# Patient Record
Sex: Male | Born: 1995 | Race: White | Hispanic: No | Marital: Single | Smoking: Never smoker
Health system: Southern US, Community
[De-identification: ages and names within clinical notes are randomized; demographics above are authoritative.]

## PROBLEM LIST (undated history)

## (undated) DIAGNOSIS — E119 Type 2 diabetes mellitus without complications: Secondary | ICD-10-CM

## (undated) DIAGNOSIS — S52509A Unspecified fracture of the lower end of unspecified radius, initial encounter for closed fracture: Secondary | ICD-10-CM

---
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
## 2013-06-24 DIAGNOSIS — S52509A Unspecified fracture of the lower end of unspecified radius, initial encounter for closed fracture: Secondary | ICD-10-CM

## 2013-06-24 HISTORY — DX: Unspecified fracture of the lower end of unspecified radius, initial encounter for closed fracture: S52.509A

## 2013-06-25 ENCOUNTER — Other Ambulatory Visit (HOSPITAL_COMMUNITY): Payer: Self-pay | Admitting: Orthopedic Surgery

## 2013-06-25 ENCOUNTER — Encounter (HOSPITAL_COMMUNITY)
Admission: RE | Admit: 2013-06-25 | Discharge: 2013-06-25 | Disposition: A | Discharge status: Home / Self Care | Payer: BC Managed Care – PPO | Source: Ambulatory Visit | Attending: Orthopedic Surgery | Admitting: Orthopedic Surgery

## 2013-06-25 ENCOUNTER — Encounter (HOSPITAL_COMMUNITY): Payer: Self-pay

## 2013-06-25 HISTORY — DX: Unspecified fracture of the lower end of unspecified radius, initial encounter for closed fracture: S52.509A

## 2013-06-25 LAB — BASIC METABOLIC PANEL
BUN: 18 mg/dL (ref 6–23)
CALCIUM: 8.9 mg/dL (ref 8.4–10.5)
CO2: 25 meq/L (ref 19–32)
CREATININE: 0.75 mg/dL (ref 0.50–1.35)
Chloride: 99 mEq/L (ref 96–112)
GFR calc Af Amer: >90 mL/min (ref >90)
GLUCOSE: 271 mg/dL — AB (ref 70–99)
Potassium: 4.5 mEq/L (ref 3.7–5.3)
SODIUM: 137 meq/L (ref 137–147)

## 2013-06-25 LAB — CBC
HCT: 40.4 % (ref 39.0–52.0)
Hemoglobin: 14.2 g/dL (ref 13.0–17.0)
MCH: 31 pg (ref 26.0–34.0)
MCHC: 35.1 g/dL (ref 30.0–36.0)
MCV: 88.2 fL (ref 78.0–100.0)
PLATELETS: 162 10*3/uL (ref 150–400)
RBC: 4.58 MIL/uL (ref 4.22–5.81)
RDW: 12.8 % (ref 11.5–15.5)
WBC: 6.5 10*3/uL (ref 4.0–10.5)

## 2013-06-25 MED ORDER — CHLORHEXIDINE GLUCONATE 4 % EX LIQD: 60.0000 mL | Freq: Once | CUTANEOUS | Status: DC

## 2013-06-25 MED ORDER — CEFAZOLIN SODIUM-DEXTROSE 2-3 GM-% IV SOLR
2.0000 g | INTRAVENOUS | Status: AC
  Administered 2013-06-26: 2 g via INTRAVENOUS

## 2013-06-25 NOTE — Pre-Procedure Instructions (Signed)
Taylor [name]  06/25/2013   Your procedure is scheduled on:  Thursday, February 5 at 12:00  [facility] Admitting at the Main Entrance "A" at 1000 AM.  Call this number if you have problems the morning of surgery: [phone]   Remember:   Do not eat food or drink liquids after midnight.tonight    Take these medicines the morning of surgery with A SIP OF WATER: pain medication if needed              Leave insulin pump on basal rate   Do not wear jewelry, make-up or nail polish.  Do not wear lotions, powders, or perfumes. You may wear deodorant.  Do not shave 48 hours prior to surgery. Men may shave face and neck.  Do not bring valuables to the hospital.  [facility] is not responsible  for any belongings or valuables.               Contacts, dentures or bridgework may not be worn into surgery.  Leave suitcase in the car. After surgery it may be brought to your room.  For patients admitted to the hospital, discharge time is determined by your                treatment team.               Patients discharged the day of surgery will not be allowed to drive  home.  Name and phone number of your driver:    Special Instructions: Shower using CHG 2 nights before surgery and the night before surgery.  If you shower the day of surgery use CHG.  Use special wash - you have one bottle of CHG for all showers.  You should use approximately 1/3 of the bottle for each shower.   Please read over the following fact sheets that you were given: Pain Booklet, Coughing and Deep Breathing and Surgical Site Infection Prevention

## 2013-06-25 NOTE — Progress Notes (Signed)
Blood glucose 271; patient informed. Insulin pump working Manufacturing engineercorrectoly. Pt states has not been eating regularly the last 2 days. Ate large meal prior to PAT. Will check CBG pre op. NPO after MN tonight. Keep insulin pump on basal rate in the morning.

## 2013-06-26 ENCOUNTER — Encounter (HOSPITAL_COMMUNITY): Payer: BC Managed Care – PPO | Admitting: Certified Registered Nurse Anesthetist

## 2013-06-26 ENCOUNTER — Ambulatory Visit (HOSPITAL_COMMUNITY)
Admission: RE | Admit: 2013-06-26 | Discharge: 2013-06-26 | Disposition: A | Discharge status: Home / Self Care | Payer: BC Managed Care – PPO | Source: Ambulatory Visit | Attending: Orthopedic Surgery | Admitting: Orthopedic Surgery

## 2013-06-26 ENCOUNTER — Ambulatory Visit (HOSPITAL_COMMUNITY): Payer: BC Managed Care – PPO | Admitting: Certified Registered Nurse Anesthetist

## 2013-06-26 ENCOUNTER — Encounter (HOSPITAL_COMMUNITY)
Admission: RE | Disposition: A | Discharge status: Home / Self Care | Payer: Self-pay | Source: Ambulatory Visit | Attending: Orthopedic Surgery

## 2013-06-26 DIAGNOSIS — S62101A Fracture of unspecified carpal bone, right wrist, initial encounter for closed fracture: Secondary | ICD-10-CM

## 2013-06-26 DIAGNOSIS — S52599A Other fractures of lower end of unspecified radius, initial encounter for closed fracture: Secondary | ICD-10-CM | POA: Insufficient documentation

## 2013-06-26 DIAGNOSIS — Z794 Long term (current) use of insulin: Secondary | ICD-10-CM | POA: Insufficient documentation

## 2013-06-26 DIAGNOSIS — Y9367 Activity, basketball: Secondary | ICD-10-CM | POA: Insufficient documentation

## 2013-06-26 DIAGNOSIS — E109 Type 1 diabetes mellitus without complications: Secondary | ICD-10-CM | POA: Insufficient documentation

## 2013-06-26 DIAGNOSIS — X58XXXA Exposure to other specified factors, initial encounter: Secondary | ICD-10-CM | POA: Insufficient documentation

## 2013-06-26 HISTORY — PX: OPEN REDUCTION INTERNAL FIXATION (ORIF) DISTAL RADIAL FRACTURE: SHX5989

## 2013-06-26 LAB — GLUCOSE, CAPILLARY
GLUCOSE-CAPILLARY: 60 mg/dL — AB (ref 70–99)
GLUCOSE-CAPILLARY: 68 mg/dL — AB (ref 70–99)
GLUCOSE-CAPILLARY: 141 mg/dL — AB (ref 70–99)
GLUCOSE-CAPILLARY: 195 mg/dL — AB (ref 70–99)
GLUCOSE-CAPILLARY: 203 mg/dL — AB (ref 70–99)
GLUCOSE-CAPILLARY: 172 mg/dL — AB (ref 70–99)

## 2013-06-26 SURGERY — OPEN REDUCTION INTERNAL FIXATION (ORIF) DISTAL RADIUS FRACTURE
Anesthesia: General | Site: Wrist | Laterality: Right

## 2013-06-26 MED ORDER — PROMETHAZINE HCL 25 MG/ML IJ SOLN
6.2500 mg | INTRAMUSCULAR | Status: DC | PRN
  Administered 2013-06-26: 12.5 mg via INTRAVENOUS

## 2013-06-26 MED ORDER — LACTATED RINGERS IV SOLN
INTRAVENOUS | Status: DC
  Administered 2013-06-26: 10:00:00 via INTRAVENOUS

## 2013-06-26 MED ORDER — ONDANSETRON HCL 4 MG/2ML IJ SOLN
INTRAMUSCULAR | Status: AC
  Filled 2013-06-26: qty 2

## 2013-06-26 MED ORDER — PROPOFOL 10 MG/ML IV BOLUS
INTRAVENOUS | Status: AC
  Filled 2013-06-26: qty 20

## 2013-06-26 MED ORDER — MEPERIDINE HCL 25 MG/ML IJ SOLN: 6.2500 mg | INTRAMUSCULAR | Status: DC | PRN

## 2013-06-26 MED ORDER — OXYCODONE HCL 5 MG/5ML PO SOLN: 5.0000 mg | Freq: Once | ORAL | Status: AC | PRN

## 2013-06-26 MED ORDER — HYDROMORPHONE HCL PF 1 MG/ML IJ SOLN
INTRAMUSCULAR | Status: AC
  Filled 2013-06-26: qty 1

## 2013-06-26 MED ORDER — HYDROMORPHONE HCL PF 1 MG/ML IJ SOLN
0.2500 mg | INTRAMUSCULAR | Status: DC | PRN
  Administered 2013-06-26 (×4): 0.5 mg via INTRAVENOUS

## 2013-06-26 MED ORDER — PROMETHAZINE HCL 25 MG/ML IJ SOLN
INTRAMUSCULAR | Status: AC
  Filled 2013-06-26: qty 1

## 2013-06-26 MED ORDER — MIDAZOLAM HCL 2 MG/2ML IJ SOLN: 0.5000 mg | Freq: Once | INTRAMUSCULAR | Status: DC | PRN

## 2013-06-26 MED ORDER — ROCURONIUM BROMIDE 50 MG/5ML IV SOLN
INTRAVENOUS | Status: AC
  Filled 2013-06-26: qty 1

## 2013-06-26 MED ORDER — LACTATED RINGERS IV SOLN
INTRAVENOUS | Status: DC | PRN
  Administered 2013-06-26 (×2): via INTRAVENOUS

## 2013-06-26 MED ORDER — FENTANYL CITRATE 0.05 MG/ML IJ SOLN
INTRAMUSCULAR | Status: AC
  Filled 2013-06-26: qty 5

## 2013-06-26 MED ORDER — PROPOFOL 10 MG/ML IV BOLUS
INTRAVENOUS | Status: DC | PRN
  Administered 2013-06-26: 200 mg via INTRAVENOUS

## 2013-06-26 MED ORDER — GLYCOPYRROLATE 0.2 MG/ML IJ SOLN
INTRAMUSCULAR | Status: DC | PRN
  Administered 2013-06-26: 0.2 mg via INTRAVENOUS

## 2013-06-26 MED ORDER — 0.9 % SODIUM CHLORIDE (POUR BTL) OPTIME
TOPICAL | Status: DC | PRN
  Administered 2013-06-26: 1000 mL

## 2013-06-26 MED ORDER — MIDAZOLAM HCL 5 MG/5ML IJ SOLN
INTRAMUSCULAR | Status: DC | PRN
  Administered 2013-06-26: 2 mg via INTRAVENOUS

## 2013-06-26 MED ORDER — LIDOCAINE HCL (CARDIAC) 20 MG/ML IV SOLN
INTRAVENOUS | Status: AC
  Filled 2013-06-26: qty 5

## 2013-06-26 MED ORDER — OXYCODONE HCL 5 MG PO TABS
5.0000 mg | ORAL_TABLET | Freq: Once | ORAL | Status: AC | PRN
  Administered 2013-06-26: 5 mg via ORAL

## 2013-06-26 MED ORDER — BUPIVACAINE HCL (PF) 0.25 % IJ SOLN
INTRAMUSCULAR | Status: AC
  Filled 2013-06-26: qty 30

## 2013-06-26 MED ORDER — CEFAZOLIN SODIUM-DEXTROSE 2-3 GM-% IV SOLR
INTRAVENOUS | Status: AC
  Filled 2013-06-26: qty 50

## 2013-06-26 MED ORDER — MIDAZOLAM HCL 2 MG/2ML IJ SOLN
INTRAMUSCULAR | Status: AC
  Filled 2013-06-26: qty 2

## 2013-06-26 MED ORDER — LIDOCAINE HCL (CARDIAC) 20 MG/ML IV SOLN
INTRAVENOUS | Status: DC | PRN
  Administered 2013-06-26: 40 mg via INTRAVENOUS

## 2013-06-26 MED ORDER — ONDANSETRON HCL 4 MG/2ML IJ SOLN
INTRAMUSCULAR | Status: DC | PRN
  Administered 2013-06-26: 4 mg via INTRAVENOUS

## 2013-06-26 MED ORDER — OXYCODONE HCL 5 MG PO TABS
ORAL_TABLET | ORAL | Status: AC
  Filled 2013-06-26: qty 1

## 2013-06-26 MED ORDER — FENTANYL CITRATE 0.05 MG/ML IJ SOLN
INTRAMUSCULAR | Status: DC | PRN
  Administered 2013-06-26: 150 ug via INTRAVENOUS
  Administered 2013-06-26: 25 ug via INTRAVENOUS
  Administered 2013-06-26: 50 ug via INTRAVENOUS
  Administered 2013-06-26: 25 ug via INTRAVENOUS

## 2013-06-26 SURGICAL SUPPLY — 69 items
BANDAGE ELASTIC 3 VELCRO ST LF (GAUZE/BANDAGES/DRESSINGS) ×3 IMPLANT
BANDAGE ELASTIC 4 VELCRO ST LF (GAUZE/BANDAGES/DRESSINGS) ×3 IMPLANT
BANDAGE GAUZE ELAST BULKY 4 IN (GAUZE/BANDAGES/DRESSINGS) IMPLANT
BIT DRILL 2.0 LNG QUCK RELEASE (BIT) ×1 IMPLANT
BIT DRILL 2.8X5 QR DISP (BIT) ×3 IMPLANT
BLADE SURG 10 STRL SS (BLADE) ×3 IMPLANT
BNDG ESMARK 4X9 LF (GAUZE/BANDAGES/DRESSINGS) IMPLANT
CLOSURE WOUND 1/2 X4 (GAUZE/BANDAGES/DRESSINGS) ×1
CLOTH BEACON ORANGE TIMEOUT ST (SAFETY) ×3 IMPLANT
CORDS BIPOLAR (ELECTRODE) ×3 IMPLANT
COVER SURGICAL LIGHT HANDLE (MISCELLANEOUS) ×3 IMPLANT
CUFF TOURNIQUET SINGLE 18IN (TOURNIQUET CUFF) ×3 IMPLANT
CUFF TOURNIQUET SINGLE 24IN (TOURNIQUET CUFF) IMPLANT
DRAIN TLS ROUND 10FR (DRAIN) IMPLANT
DRAPE INCISE IOBAN 66X45 STRL (DRAPES) IMPLANT
DRAPE OEC MINIVIEW 54X84 (DRAPES) IMPLANT
DRAPE U-SHAPE 47X51 STRL (DRAPES) ×3 IMPLANT
DRILL 2.0 LNG QUICK RELEASE (BIT) ×3
DRSG PAD ABDOMINAL 8X10 ST (GAUZE/BANDAGES/DRESSINGS) ×3 IMPLANT
DURAPREP 26ML APPLICATOR (WOUND CARE) ×3 IMPLANT
ELECT REM PT RETURN 9FT ADLT (ELECTROSURGICAL) ×3
ELECTRODE REM PT RTRN 9FT ADLT (ELECTROSURGICAL) ×1 IMPLANT
FACESHIELD LNG OPTICON STERILE (SAFETY) ×3 IMPLANT
GAUZE XEROFORM 1X8 LF (GAUZE/BANDAGES/DRESSINGS) ×3 IMPLANT
GLOVE BIO SURGEON ST LM GN SZ9 (GLOVE) ×3 IMPLANT
GLOVE BIOGEL PI IND STRL 8 (GLOVE) ×1 IMPLANT
GLOVE BIOGEL PI INDICATOR 8 (GLOVE) ×2
GLOVE SURG ORTHO 8.0 STRL STRW (GLOVE) ×3 IMPLANT
GOWN PREVENTION PLUS LG XLONG (DISPOSABLE) IMPLANT
GOWN PREVENTION PLUS XLARGE (GOWN DISPOSABLE) ×3 IMPLANT
GOWN STRL NON-REIN LRG LVL3 (GOWN DISPOSABLE) ×6 IMPLANT
GUIDEWIRE ORTHO 0.054X6 (WIRE) ×6 IMPLANT
KIT BASIN OR (CUSTOM PROCEDURE TRAY) ×3 IMPLANT
KIT ROOM TURNOVER OR (KITS) ×3 IMPLANT
MANIFOLD NEPTUNE II (INSTRUMENTS) ×3 IMPLANT
NEEDLE 22X1 1/2 (OR ONLY) (NEEDLE) IMPLANT
NS IRRIG 1000ML POUR BTL (IV SOLUTION) ×3 IMPLANT
PACK ORTHO EXTREMITY (CUSTOM PROCEDURE TRAY) ×3 IMPLANT
PAD ARMBOARD 7.5X6 YLW CONV (MISCELLANEOUS) ×6 IMPLANT
PAD CAST 3X4 CTTN HI CHSV (CAST SUPPLIES) ×1 IMPLANT
PAD CAST 4YDX4 CTTN HI CHSV (CAST SUPPLIES) ×1 IMPLANT
PADDING CAST COTTON 3X4 STRL (CAST SUPPLIES) ×2
PADDING CAST COTTON 4X4 STRL (CAST SUPPLIES) ×2
PLATE WIDE RIGHT DISTAL (Plate) ×3 IMPLANT
SCREW CORT FT 20X2.3XLCK HEX (Screw) ×2 IMPLANT
SCREW CORT FT 22X2.3XLCK HEX (Screw) ×5 IMPLANT
SCREW CORTICAL LOCKING 2.3X20M (Screw) ×4 IMPLANT
SCREW CORTICAL LOCKING 2.3X22M (Screw) ×10 IMPLANT
SCREW HEXALOBE LOCKING 3.5X16M (Screw) ×3 IMPLANT
SCREW HEXALOBE NON-LOCK 3.5X14 (Screw) ×3 IMPLANT
SCREW HEXALOBE NON-LOCK 3.5X16 (Screw) ×6 IMPLANT
SPONGE GAUZE 4X4 12PLY (GAUZE/BANDAGES/DRESSINGS) IMPLANT
SPONGE GAUZE 4X4 12PLY STER LF (GAUZE/BANDAGES/DRESSINGS) ×3 IMPLANT
SPONGE LAP 4X18 X RAY DECT (DISPOSABLE) ×6 IMPLANT
STAPLER VISISTAT 35W (STAPLE) IMPLANT
STRIP CLOSURE SKIN 1/2X4 (GAUZE/BANDAGES/DRESSINGS) ×2 IMPLANT
SUCTION FRAZIER TIP 10 FR DISP (SUCTIONS) ×3 IMPLANT
SUT ETHILON 3 0 PS 1 (SUTURE) IMPLANT
SUT PROLENE 3 0 PS 1 (SUTURE) IMPLANT
SUT VIC AB 2-0 CTB1 (SUTURE) IMPLANT
SUT VIC AB 3-0 X1 27 (SUTURE) IMPLANT
SYR CONTROL 10ML LL (SYRINGE) IMPLANT
SYSTEM CHEST DRAIN TLS 7FR (DRAIN) IMPLANT
TOWEL OR 17X24 6PK STRL BLUE (TOWEL DISPOSABLE) ×3 IMPLANT
TOWEL OR 17X26 10 PK STRL BLUE (TOWEL DISPOSABLE) ×3 IMPLANT
TUBE CONNECTING 12'X1/4 (SUCTIONS) ×1
TUBE CONNECTING 12X1/4 (SUCTIONS) ×2 IMPLANT
WATER STERILE IRR 1000ML POUR (IV SOLUTION) ×3 IMPLANT
YANKAUER SUCT BULB TIP NO VENT (SUCTIONS) IMPLANT

## 2013-06-26 NOTE — Brief Op Note (Signed)
06/26/2013  3:01 PM  PATIENT:  Taylor [name]  18 y.o. male  PRE-OPERATIVE DIAGNOSIS:  RIGHT DISTAL RADIUS FRACTURE  POST-OPERATIVE DIAGNOSIS:  RIGHT DISTAL RADIUS FRACTURE  PROCEDURE:  Procedure(s): OPEN REDUCTION INTERNAL FIXATION (ORIF) DISTAL RADIAL FRACTURE  SURGEON:  Surgeon(s): Cammy CopaGregory Scott Marilu Rylander, MD  ASSISTANT: s vernon pa  ANESTHESIA:   general  EBL: 15 ml    Total I/O In: 1000 [I.V.:1000] Out: -   BLOOD ADMINISTERED: none  DRAINS: none   LOCAL MEDICATIONS USED:  none  SPECIMEN:  No Specimen  COUNTS:  YES  TOURNIQUET:   Total Tourniquet Time Documented: Upper Arm (Right) - 60 minutes Total: Upper Arm (Right) - 60 minutes   DICTATION: .Other Dictation: Dictation Number [id number]  PLAN OF CARE: Discharge to home after PACU  PATIENT DISPOSITION:  PACU - hemodynamically stable

## 2013-06-26 NOTE — Transfer of Care (Signed)
Immediate Anesthesia Transfer of Care Note  Patient: Taylor [name]  Procedure(s) Performed: Procedure(s): OPEN REDUCTION INTERNAL FIXATION (ORIF) DISTAL RADIAL FRACTURE (Right)  Patient Location: PACU  Anesthesia Type:General  Level of Consciousness: awake, alert  and oriented  Airway & Oxygen Therapy: Patient Spontanous Breathing and Patient connected to nasal cannula oxygen  Post-op Assessment: Report given to PACU RN, Post -op Vital signs reviewed and stable and Patient moving all extremities  Post vital signs: Reviewed and stable  Complications: No apparent anesthesia complications

## 2013-06-26 NOTE — H&P (Signed)
Taylor [name] is an 18 y.o. male.   Chief Complaint: right wrist pain HPI: 18 yo pt who fell on wrist 2 days ago in basketball game - radius fracture incurred - no other complaints - denies other injuries or paresthesias.  Past Medical History  Diagnosis Date  . Diabetes mellitus without complication   . Distal radial fracture 06/24/2013    right    Past Surgical History  Procedure Laterality Date  . Fracture surgery Left 2011    distal radius/ulna    No family history on file. Social History:  reports that he has never smoked. He has never used smokeless tobacco. He reports that he does not drink alcohol or use illicit drugs.  Allergies: No Known Allergies  No prescriptions prior to admission    Results for orders placed during the hospital encounter of 06/25/13 (from the past 48 hour(s))  BASIC METABOLIC PANEL     Status: Abnormal   Collection Time    06/25/13  2:31 PM      Result Value Range   Sodium 137  137 - 147 mEq/L   Potassium 4.5  3.7 - 5.3 mEq/L   Chloride 99  96 - 112 mEq/L   CO2 25  19 - 32 mEq/L   Glucose, Bld 271 (*) 70 - 99 mg/dL   BUN 18  6 - 23 mg/dL   Creatinine, Ser 0.75  0.50 - 1.35 mg/dL   Calcium 8.9  8.4 - 10.5 mg/dL   GFR calc non Af Amer >90  >90 mL/min   GFR calc Af Amer >90  >90 mL/min   Comment: (NOTE)     The eGFR has been calculated using the CKD EPI equation.     This calculation has not been validated in all clinical situations.     eGFR's persistently <90 mL/min signify possible Chronic Kidney     Disease.  CBC     Status: None   Collection Time    06/25/13  2:31 PM      Result Value Range   WBC 6.5  4.0 - 10.5 K/uL   RBC 4.58  4.22 - 5.81 MIL/uL   Hemoglobin 14.2  13.0 - 17.0 g/dL   HCT 40.4  39.0 - 52.0 %   MCV 88.2  78.0 - 100.0 fL   MCH 31.0  26.0 - 34.0 pg   MCHC 35.1  30.0 - 36.0 g/dL   RDW 12.8  11.5 - 15.5 %   Platelets 162  150 - 400 K/uL   No results found.  Review of Systems  Constitutional: Negative.   HENT:  Negative.   Eyes: Negative.   Respiratory: Negative.   Cardiovascular: Negative.   Gastrointestinal: Negative.   Genitourinary: Negative.   Musculoskeletal: Positive for joint pain.  Skin: Negative.   Neurological: Negative.   Endo/Heme/Allergies: Negative.   Psychiatric/Behavioral: Negative.     There were no vitals taken for this visit. Physical Exam  Constitutional: He appears well-developed.  HENT:  Head: Normocephalic.  Eyes: Pupils are equal, round, and reactive to light.  Neck: Normal range of motion.  Cardiovascular: Normal rate.   Respiratory: Effort normal.  GI: Soft.  Neurological: He is alert.  Skin: Skin is warm.  Psychiatric: He has a normal mood and affect.  right wrist swelling and mild deformity - rad 2/4 - skin intact - mild index paresthesia - elbow rom full   Assessment/Plan Distal radius fracture with dorsal angulation - plan orif with plating - risks including  infxn - stiffness - weakness - nerve and vessel damage all discussed - all questions answered  Joangel Vanosdol SCOTT 06/26/2013, 8:26 AM

## 2013-06-26 NOTE — Discharge Instructions (Signed)

## 2013-06-26 NOTE — Anesthesia Preprocedure Evaluation (Addendum)
Anesthesia Evaluation  Patient identified by MRN, date of birth, ID band Patient awake    Reviewed: Allergy & Precautions, H&P , NPO status , Patient's Chart, lab work & pertinent test results  History of Anesthesia Complications Negative for: history of anesthetic complications  Airway Mallampati: I TM Distance: >3 FB Neck ROM: Full    Dental  (+) Dental Advisory Given and Teeth Intact   Pulmonary neg pulmonary ROS,  breath sounds clear to auscultation  Pulmonary exam normal       Cardiovascular negative cardio ROS  Rhythm:Regular Rate:Normal     Neuro/Psych negative neurological ROS     GI/Hepatic negative GI ROS, Neg liver ROS,   Endo/Other  diabetes (insulin pump, glu 60), Type 1, Insulin Dependent  Renal/GU negative Renal ROS     Musculoskeletal   Abdominal   Peds  Hematology negative hematology ROS (+)   Anesthesia Other Findings   Reproductive/Obstetrics                         Anesthesia Physical Anesthesia Plan  ASA: II  Anesthesia Plan: General   Post-op Pain Management:    Induction: Intravenous  Airway Management Planned: LMA  Additional Equipment:   Intra-op Plan:   Post-operative Plan:   Informed Consent: I have reviewed the patients History and Physical, chart, labs and discussed the procedure including the risks, benefits and alternatives for the proposed anesthesia with the patient or authorized representative who has indicated his/her understanding and acceptance.   Dental advisory given  Plan Discussed with: CRNA and Surgeon  Anesthesia Plan Comments: (Plan routine monitors, GA- LMA OK Pt is college basketball player, declines regional nerve block)       Anesthesia Quick Evaluation

## 2013-06-26 NOTE — Anesthesia Postprocedure Evaluation (Signed)
Anesthesia Post Note  Patient: Taylor [name]  Procedure(s) Performed: Procedure(s) (LRB): OPEN REDUCTION INTERNAL FIXATION (ORIF) DISTAL RADIAL FRACTURE (Right)  Anesthesia type: General  Patient location: PACU  Post pain: Pain level controlled  Post assessment: Patient's Cardiovascular Status Stable  Last Vitals:  Filed Vitals:   06/26/13 1741  BP: 118/57  Pulse: 42  Temp:   Resp:     Post vital signs: Reviewed and stable  Level of consciousness: alert  Complications: No apparent anesthesia complications

## 2013-06-27 NOTE — Op Note (Signed)
NAMMarybelle Killings:  [name], Taylor [name]                ACCOUNT NO.:  [id number]  MEDICAL RECORD NO.:  [id number]  LOCATION:  [facility]                         FACILITY:  [facility]  PHYSICIAN:  Burnard BuntingG. Scott Dean, M.D.    DATE OF BIRTH:  February 23, 1996  DATE OF PROCEDURE:  06/26/2013 DATE OF DISCHARGE:                              OPERATIVE REPORT   PREOPERATIVE DIAGNOSIS:  Right distal radius fracture displaced.  POSTOPERATIVE DIAGNOSIS:  Right distal radius fracture displaced.  PROCEDURE:  Right distal radius fracture open reduction and internal fixation.  SURGEON:  Burnard BuntingG. Scott Dean, M.D.  ASSISTANT:  Wende NeighborsSheila M. Vernon, P.A.  ANESTHESIA:  General endotracheal.  ESTIMATED BLOOD LOSS:  50 mL.  DRAINS:  None.  TOURNIQUET TIME:  60 minutes at 250 mmHg.  INDICATIONS:  Taylor [name] is a 18 year old patient who injured his right wrist playing basketball.  X-ray showed displaced dorsally angulated distal radius fracture, presents now for operative management after explanation of risks and benefits.  PROCEDURE IN DETAIL:  The patient was brought to the operating room where general endotracheal anesthesia was induced.  Preop antibiotics were administered.  Time-out was called.  Right arm was prescrubbed with alcohol and Betadine, which were allowed to air dry.  Prepped with DuraPrep solution and draped in sterile manner.  Collier Flowersoban was used to cover the operative field.  Arm was elevated and exsanguinated with an Esmarch wrap.  Tourniquet was inflated to 250 mmHg.  An incision was made over the FCR tendon, the sheath was incised, tendon mobilized laterally.  The posterior aspect of the sheath was then incised.  Median nerve was identified and protected.  Radial artery and venae comitantes were also identified and retracted.  Pronator quadratus incised on its radial aspect with a periosteal elevator, incised off of the distal radius. The fracture was visualized.  Fracture was reduced and reduction was confirmed in the AP  and lateral planes under fluoroscopy.  Acumed plate was then applied with 2 screws in a row distally as well as proximally. The articular surface restored to neutral height and inclination also normal.  Screws did not penetrate outside of the far cortices.  This was confirmed in the AP and lateral planes under fluoroscopy.  Fracture was stable.  At this time, incision was thoroughly irrigated.  Tourniquet was released.  Bleeding points were encountered and controlled with bipolar electrocautery.  Skin was closed using interrupted inverted 2-0 Vicryl suture and 3-0 nylon suture.  Bulky splint was applied.  Velna HatchetSheila Vernon's assistance was required during the case for opening, closing, limb positioning, and hardware positioning.  Her assistance was a medical necessity.  The patient will be discharged home.     Burnard BuntingG. Scott Dean, M.D.     GSD/MEDQ  D:  06/26/2013  T:  06/27/2013  Job:  [id number]

## 2013-06-30 ENCOUNTER — Encounter (HOSPITAL_COMMUNITY): Payer: Self-pay | Admitting: Orthopedic Surgery

## 2015-09-29 IMAGING — CR DG WRIST 2V*R*
2 series · 2 of 2 positions shown · non-contrast
Comparison: None.

CLINICAL DATA: Postoperative assessment of the right wrist.

EXAM:
RIGHT WRIST - 2 VIEW

[PA]
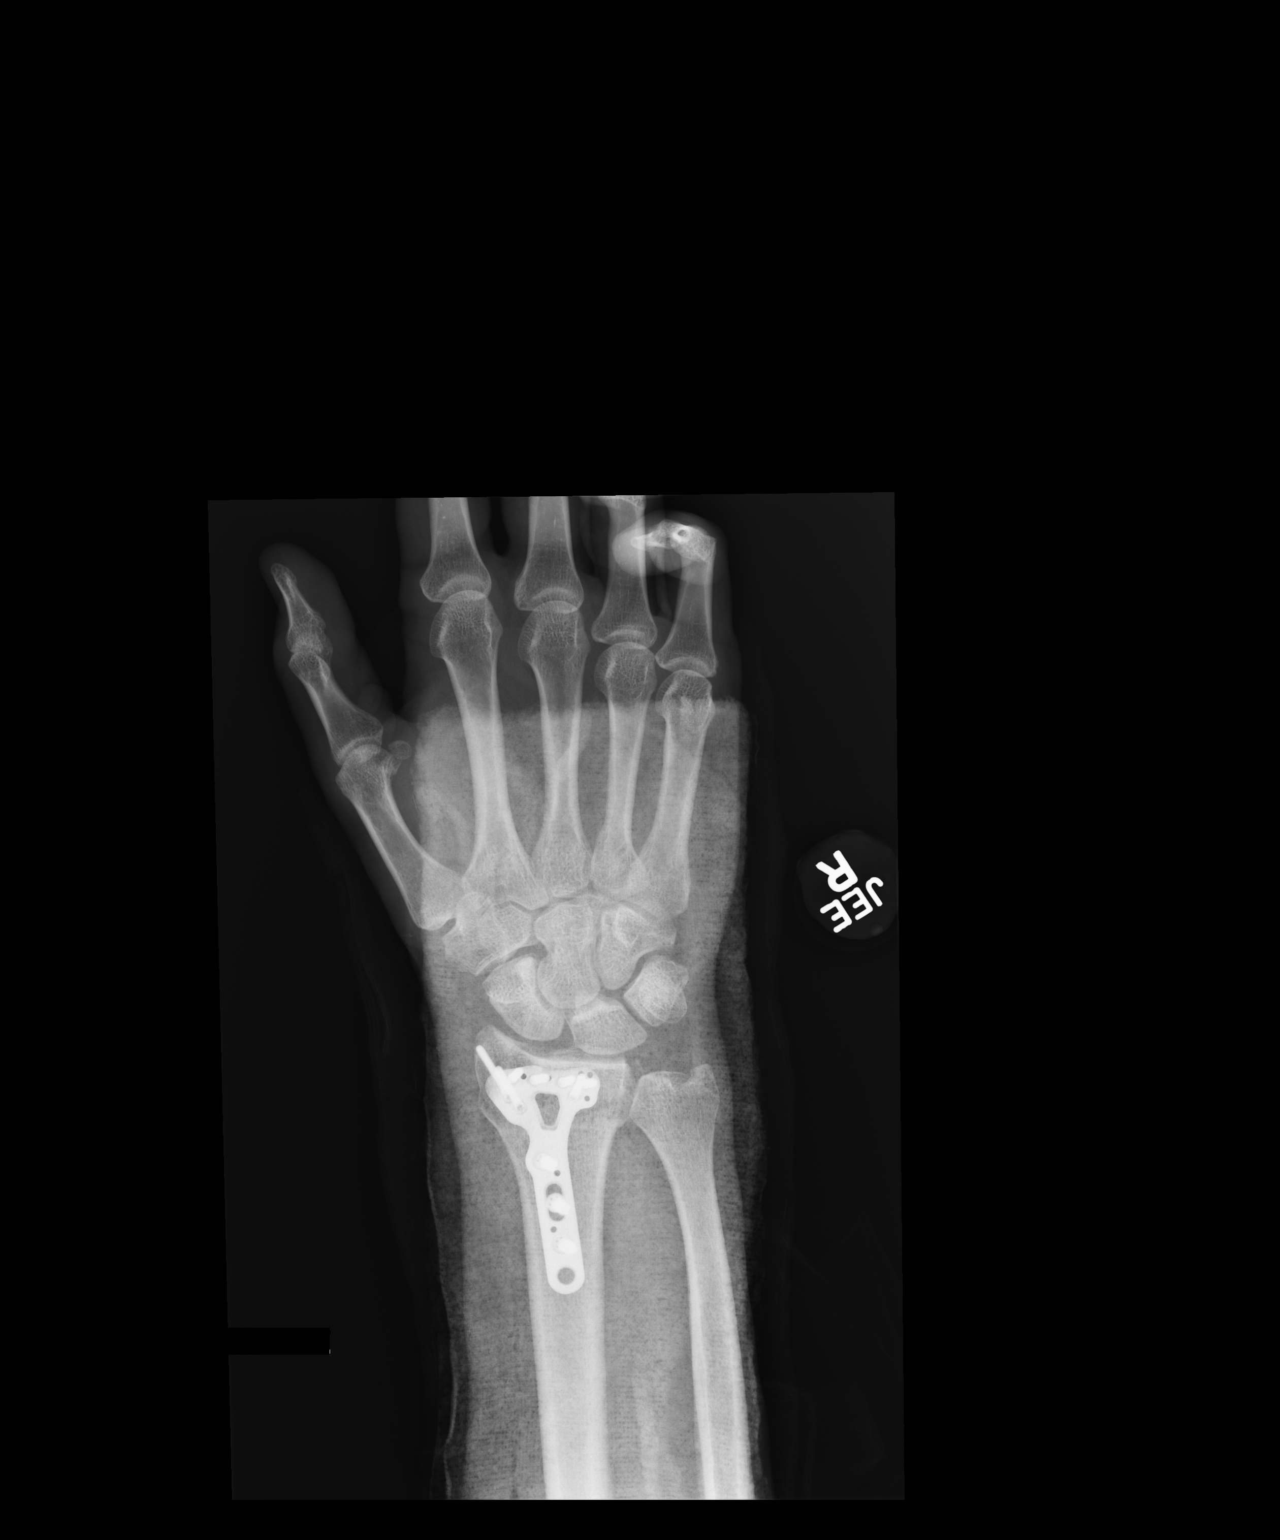

[lateral]
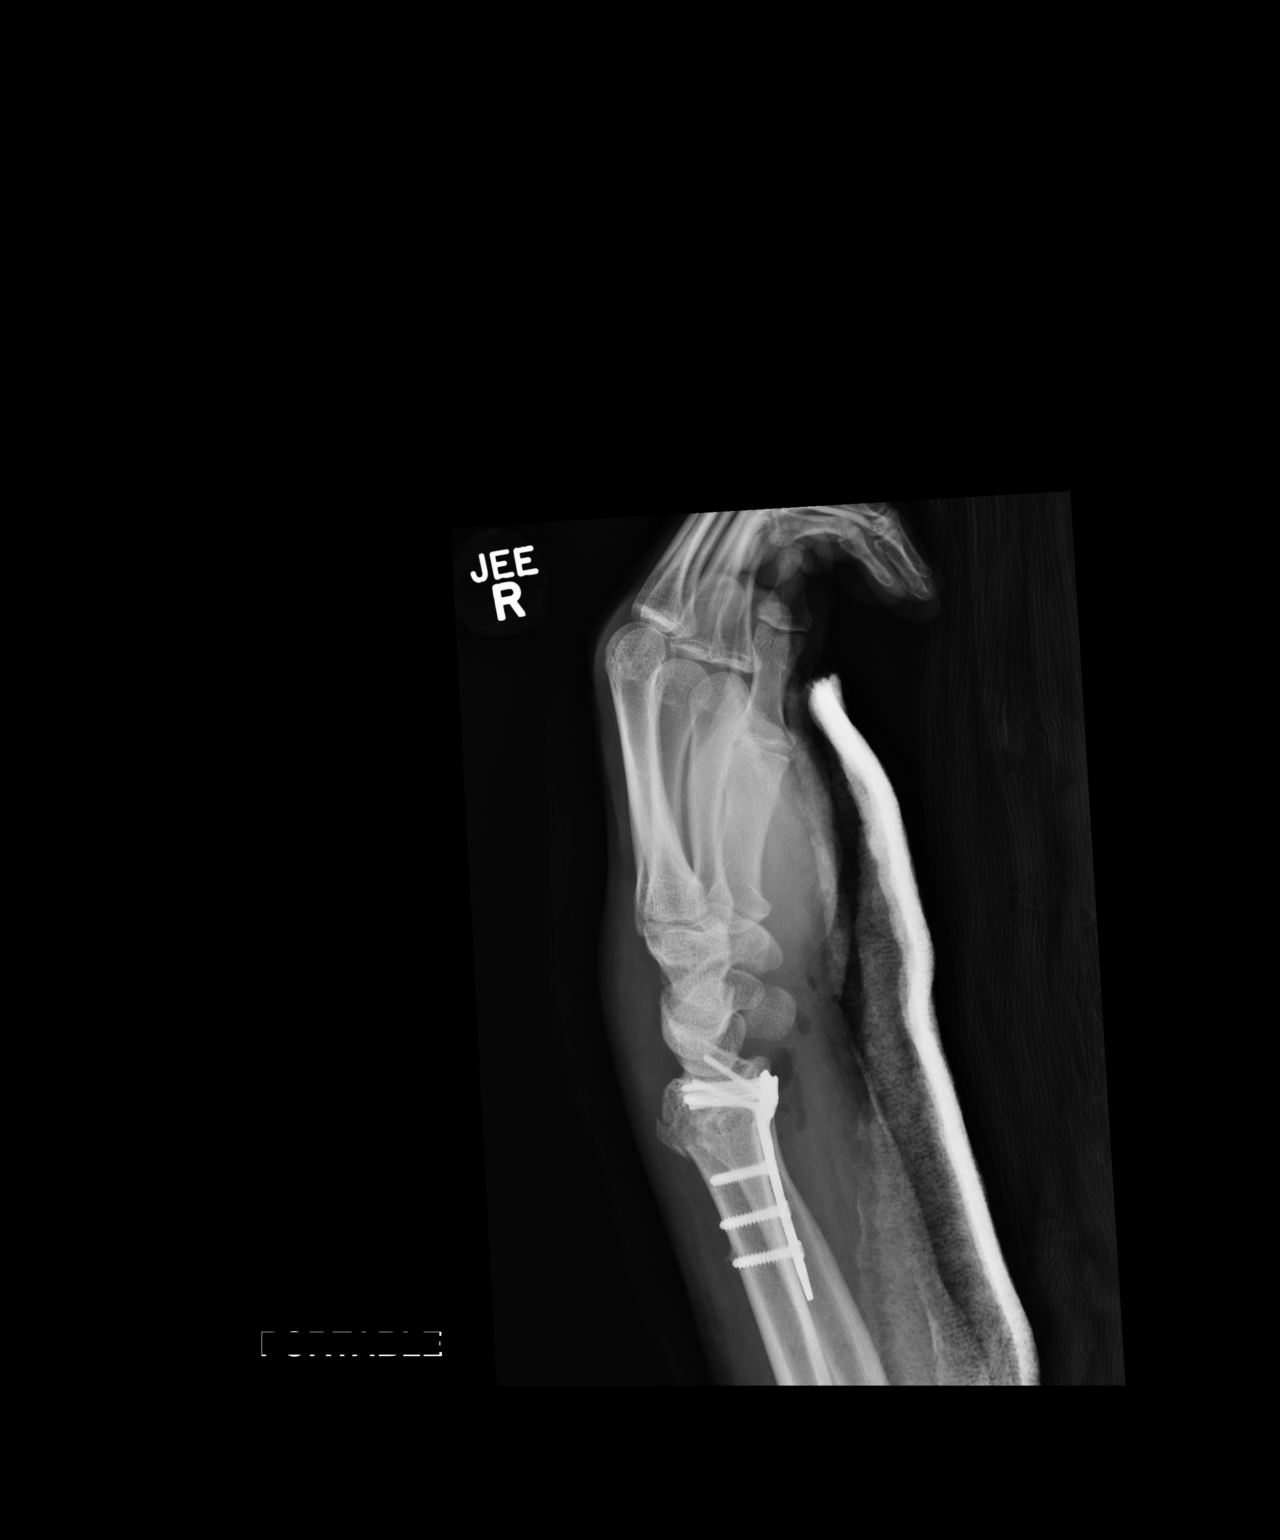

[2 of 2 positions shown; findings below may reference images not displayed]

FINDINGS: Volar plate and screw fixator noted with near-anatomic alignment and
positioning. No complicating feature observed. Cortical irregularity
posteriorly along the distal radial metaphysis noted. Gas is present
in the volar soft tissues and there is a volar plaster splint.
IMPRESSION: 1. Plate and screw fixation of the distal radial fracture with
near-anatomic alignment and positioning.
